# Patient Record
Sex: Male | Born: 1980 | Race: White | Hispanic: No | Marital: Single
Health system: Southern US, Community
[De-identification: ages and names within clinical notes are randomized; demographics above are authoritative.]

---
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
## 1998-03-03 ENCOUNTER — Encounter: Admission: RE | Admit: 1998-03-03 | Discharge: 1998-03-03 | Payer: Self-pay | Admitting: Internal Medicine

## 1998-05-20 ENCOUNTER — Encounter: Admission: RE | Admit: 1998-05-20 | Discharge: 1998-05-20 | Payer: Self-pay | Admitting: Internal Medicine

## 1999-01-24 ENCOUNTER — Encounter: Admission: RE | Admit: 1999-01-24 | Discharge: 1999-01-24 | Payer: Self-pay | Admitting: Internal Medicine

## 1999-01-24 ENCOUNTER — Ambulatory Visit (HOSPITAL_COMMUNITY): Admission: RE | Admit: 1999-01-24 | Discharge: 1999-01-24 | Payer: Self-pay | Admitting: Internal Medicine

## 1999-07-20 ENCOUNTER — Ambulatory Visit (HOSPITAL_COMMUNITY): Admission: RE | Admit: 1999-07-20 | Discharge: 1999-07-20 | Payer: Self-pay | Admitting: Internal Medicine

## 1999-07-20 ENCOUNTER — Encounter: Admission: RE | Admit: 1999-07-20 | Discharge: 1999-07-20 | Payer: Self-pay | Admitting: Internal Medicine

## 1999-08-08 ENCOUNTER — Encounter: Admission: RE | Admit: 1999-08-08 | Discharge: 1999-08-08 | Payer: Self-pay | Admitting: Internal Medicine

## 1999-12-14 ENCOUNTER — Encounter: Admission: RE | Admit: 1999-12-14 | Discharge: 1999-12-14 | Payer: Self-pay | Admitting: Hematology and Oncology

## 1999-12-14 ENCOUNTER — Ambulatory Visit (HOSPITAL_COMMUNITY): Admission: RE | Admit: 1999-12-14 | Discharge: 1999-12-14 | Payer: Self-pay | Admitting: Hematology and Oncology

## 2000-03-02 ENCOUNTER — Encounter: Admission: RE | Admit: 2000-03-02 | Discharge: 2000-03-02 | Payer: Self-pay | Admitting: Internal Medicine

## 2000-03-06 ENCOUNTER — Ambulatory Visit (HOSPITAL_COMMUNITY): Admission: RE | Admit: 2000-03-06 | Discharge: 2000-03-06 | Payer: Self-pay | Admitting: Internal Medicine

## 2000-03-06 ENCOUNTER — Encounter: Admission: RE | Admit: 2000-03-06 | Discharge: 2000-03-06 | Payer: Self-pay | Admitting: Internal Medicine

## 2000-04-24 ENCOUNTER — Encounter: Admission: RE | Admit: 2000-04-24 | Discharge: 2000-04-24 | Payer: Self-pay | Admitting: Hematology and Oncology

## 2000-06-27 ENCOUNTER — Encounter: Admission: RE | Admit: 2000-06-27 | Discharge: 2000-06-27 | Payer: Self-pay | Admitting: Internal Medicine

## 2000-07-27 ENCOUNTER — Encounter: Admission: RE | Admit: 2000-07-27 | Discharge: 2000-07-27 | Payer: Self-pay | Admitting: Internal Medicine

## 2000-07-27 ENCOUNTER — Ambulatory Visit (HOSPITAL_COMMUNITY): Admission: RE | Admit: 2000-07-27 | Discharge: 2000-07-27 | Payer: Self-pay | Admitting: Internal Medicine

## 2001-01-24 ENCOUNTER — Encounter: Admission: RE | Admit: 2001-01-24 | Discharge: 2001-01-24 | Payer: Self-pay | Admitting: Internal Medicine

## 2001-01-24 ENCOUNTER — Ambulatory Visit (HOSPITAL_COMMUNITY): Admission: RE | Admit: 2001-01-24 | Discharge: 2001-01-24 | Payer: Self-pay | Admitting: Internal Medicine

## 2001-07-25 ENCOUNTER — Encounter: Admission: RE | Admit: 2001-07-25 | Discharge: 2001-07-25 | Payer: Self-pay | Admitting: Internal Medicine

## 2001-09-17 ENCOUNTER — Encounter: Admission: RE | Admit: 2001-09-17 | Discharge: 2001-09-17 | Payer: Self-pay

## 2001-09-30 ENCOUNTER — Encounter: Admission: RE | Admit: 2001-09-30 | Discharge: 2001-09-30 | Payer: Self-pay | Admitting: Internal Medicine

## 2002-06-11 ENCOUNTER — Ambulatory Visit (HOSPITAL_COMMUNITY): Admission: RE | Admit: 2002-06-11 | Discharge: 2002-06-11 | Payer: Self-pay | Admitting: Internal Medicine

## 2002-06-11 ENCOUNTER — Encounter: Admission: RE | Admit: 2002-06-11 | Discharge: 2002-06-11 | Payer: Self-pay | Admitting: Internal Medicine

## 2002-11-06 ENCOUNTER — Encounter: Admission: RE | Admit: 2002-11-06 | Discharge: 2002-11-06 | Payer: Self-pay | Admitting: Internal Medicine

## 2004-03-01 ENCOUNTER — Ambulatory Visit (HOSPITAL_COMMUNITY): Admission: RE | Admit: 2004-03-01 | Discharge: 2004-03-01 | Payer: Self-pay | Admitting: Internal Medicine

## 2004-03-01 ENCOUNTER — Ambulatory Visit: Payer: Self-pay | Admitting: Internal Medicine

## 2004-03-02 ENCOUNTER — Emergency Department (HOSPITAL_COMMUNITY): Admission: EM | Admit: 2004-03-02 | Discharge: 2004-03-02 | Payer: Self-pay | Admitting: Emergency Medicine

## 2004-03-08 ENCOUNTER — Ambulatory Visit: Payer: Self-pay | Admitting: Internal Medicine

## 2004-11-14 ENCOUNTER — Ambulatory Visit (HOSPITAL_COMMUNITY): Admission: RE | Admit: 2004-11-14 | Discharge: 2004-11-14 | Payer: Self-pay | Admitting: Internal Medicine

## 2004-11-14 ENCOUNTER — Ambulatory Visit: Payer: Self-pay | Admitting: Internal Medicine

## 2004-11-17 ENCOUNTER — Ambulatory Visit: Payer: Self-pay | Admitting: Internal Medicine

## 2004-11-21 ENCOUNTER — Ambulatory Visit: Payer: Self-pay | Admitting: Internal Medicine

## 2005-01-20 ENCOUNTER — Ambulatory Visit: Payer: Self-pay | Admitting: Internal Medicine

## 2005-01-20 ENCOUNTER — Ambulatory Visit (HOSPITAL_COMMUNITY): Admission: RE | Admit: 2005-01-20 | Discharge: 2005-01-20 | Payer: Self-pay | Admitting: Internal Medicine

## 2005-02-02 ENCOUNTER — Emergency Department (HOSPITAL_COMMUNITY): Admission: EM | Admit: 2005-02-02 | Discharge: 2005-02-02 | Payer: Self-pay | Admitting: Emergency Medicine

## 2005-02-08 ENCOUNTER — Inpatient Hospital Stay (HOSPITAL_COMMUNITY): Admission: AD | Admit: 2005-02-08 | Discharge: 2005-02-15 | Payer: Self-pay | Admitting: Internal Medicine

## 2005-02-08 ENCOUNTER — Ambulatory Visit: Payer: Self-pay | Admitting: Infectious Diseases

## 2005-02-08 ENCOUNTER — Ambulatory Visit: Payer: Self-pay | Admitting: Hospitalist

## 2005-02-09 ENCOUNTER — Encounter (INDEPENDENT_AMBULATORY_CARE_PROVIDER_SITE_OTHER): Payer: Self-pay | Admitting: *Deleted

## 2005-02-09 LAB — CONVERTED CEMR LAB
CD4 Count: 642 uL
HIV 1 RNA Quant: 6460 {copies}/mL

## 2005-02-14 ENCOUNTER — Encounter (INDEPENDENT_AMBULATORY_CARE_PROVIDER_SITE_OTHER): Payer: Self-pay | Admitting: Cardiovascular Disease

## 2005-02-23 ENCOUNTER — Ambulatory Visit (HOSPITAL_COMMUNITY): Admission: RE | Admit: 2005-02-23 | Discharge: 2005-02-23 | Payer: Self-pay | Admitting: Internal Medicine

## 2005-02-23 ENCOUNTER — Ambulatory Visit: Payer: Self-pay | Admitting: Internal Medicine

## 2005-04-06 ENCOUNTER — Ambulatory Visit: Payer: Self-pay | Admitting: Internal Medicine

## 2005-04-06 ENCOUNTER — Ambulatory Visit (HOSPITAL_COMMUNITY): Admission: RE | Admit: 2005-04-06 | Discharge: 2005-04-06 | Payer: Self-pay | Admitting: Internal Medicine

## 2005-06-27 ENCOUNTER — Encounter (INDEPENDENT_AMBULATORY_CARE_PROVIDER_SITE_OTHER): Payer: Self-pay | Admitting: *Deleted

## 2005-06-27 ENCOUNTER — Ambulatory Visit: Payer: Self-pay | Admitting: Internal Medicine

## 2005-06-27 LAB — CONVERTED CEMR LAB
CD4 Count: 130 microliters
HIV 1 RNA Quant: 1000001 copies/mL

## 2005-08-03 ENCOUNTER — Ambulatory Visit: Payer: Self-pay | Admitting: Internal Medicine

## 2005-10-03 ENCOUNTER — Ambulatory Visit: Payer: Self-pay | Admitting: Internal Medicine

## 2006-04-03 ENCOUNTER — Encounter (INDEPENDENT_AMBULATORY_CARE_PROVIDER_SITE_OTHER): Payer: Self-pay | Admitting: *Deleted

## 2006-04-03 ENCOUNTER — Ambulatory Visit: Payer: Self-pay | Admitting: Internal Medicine

## 2006-04-03 ENCOUNTER — Encounter: Admission: RE | Admit: 2006-04-03 | Discharge: 2006-04-03 | Payer: Self-pay | Admitting: Internal Medicine

## 2006-04-03 LAB — CONVERTED CEMR LAB
ALT: 24 units/L (ref 0–40)
AST: 18 units/L (ref 0–37)
Albumin: 4.2 g/dL (ref 3.5–5.2)
Alkaline Phosphatase: 61 units/L (ref 39–117)
BUN: 11 mg/dL (ref 6–23)
CD4 Count: 150 microliters
CO2: 26 meq/L (ref 19–32)
Calcium: 9.4 mg/dL (ref 8.4–10.5)
Chloride: 105 meq/L (ref 96–112)
Cholesterol: 140 mg/dL (ref 0–200)
Creatinine, Ser: 0.93 mg/dL (ref 0.40–1.50)
Glucose, Bld: 98 mg/dL (ref 70–99)
HCT: 40.3 % (ref 39.0–52.0)
HDL: 34 mg/dL — ABNORMAL LOW (ref >39)
HIV 1 RNA Quant: 74300 copies/mL
HIV 1 RNA Quant: 74300 copies/mL — ABNORMAL HIGH (ref <50)
HIV-1 RNA Quant, Log: 4.87 — ABNORMAL HIGH (ref <1.70)
Hemoglobin: 12.7 g/dL — ABNORMAL LOW (ref 13.0–17.0)
LDL Cholesterol: 55 mg/dL (ref 0–99)
Leukocyte count, blood: 5.1 10*9/L (ref 4.0–10.5)
MCHC: 31.5 g/dL (ref 30.0–36.0)
MCV: 92.2 fL (ref 78.0–100.0)
Platelets: 229 10*3/uL (ref 150–400)
Potassium: 4.4 meq/L (ref 3.5–5.3)
RBC: 4.37 M/uL (ref 4.22–5.81)
RDW: 14.7 % — ABNORMAL HIGH (ref 11.5–14.0)
Sodium: 139 meq/L (ref 135–145)
Total Bilirubin: 0.3 mg/dL (ref 0.3–1.2)
Total CHOL/HDL Ratio: 4.1
Total Protein: 8.4 g/dL — ABNORMAL HIGH (ref 6.0–8.3)
Triglycerides: 254 mg/dL — ABNORMAL HIGH (ref <150)
VLDL: 51 mg/dL — ABNORMAL HIGH (ref 0–40)

## 2006-04-24 ENCOUNTER — Ambulatory Visit: Payer: Self-pay | Admitting: Internal Medicine

## 2006-06-14 ENCOUNTER — Ambulatory Visit: Payer: Self-pay | Admitting: Infectious Diseases

## 2006-07-30 ENCOUNTER — Encounter (INDEPENDENT_AMBULATORY_CARE_PROVIDER_SITE_OTHER): Payer: Self-pay | Admitting: *Deleted

## 2006-07-30 LAB — CONVERTED CEMR LAB

## 2006-08-12 ENCOUNTER — Encounter (INDEPENDENT_AMBULATORY_CARE_PROVIDER_SITE_OTHER): Payer: Self-pay | Admitting: *Deleted

## 2006-08-16 ENCOUNTER — Ambulatory Visit: Payer: Self-pay | Admitting: Internal Medicine

## 2006-08-17 ENCOUNTER — Telehealth: Payer: Self-pay

## 2006-10-11 ENCOUNTER — Ambulatory Visit: Payer: Self-pay | Admitting: Infectious Diseases

## 2006-10-14 IMAGING — CR DG CHEST 2V
2 series · 2 of 2 positions shown · non-contrast
Comparison: 03/02/04.

CLINICAL DATA: HIV positive.  Cough.  
 CHEST - 2 VIEW:

[view not recorded (1 of 2)]
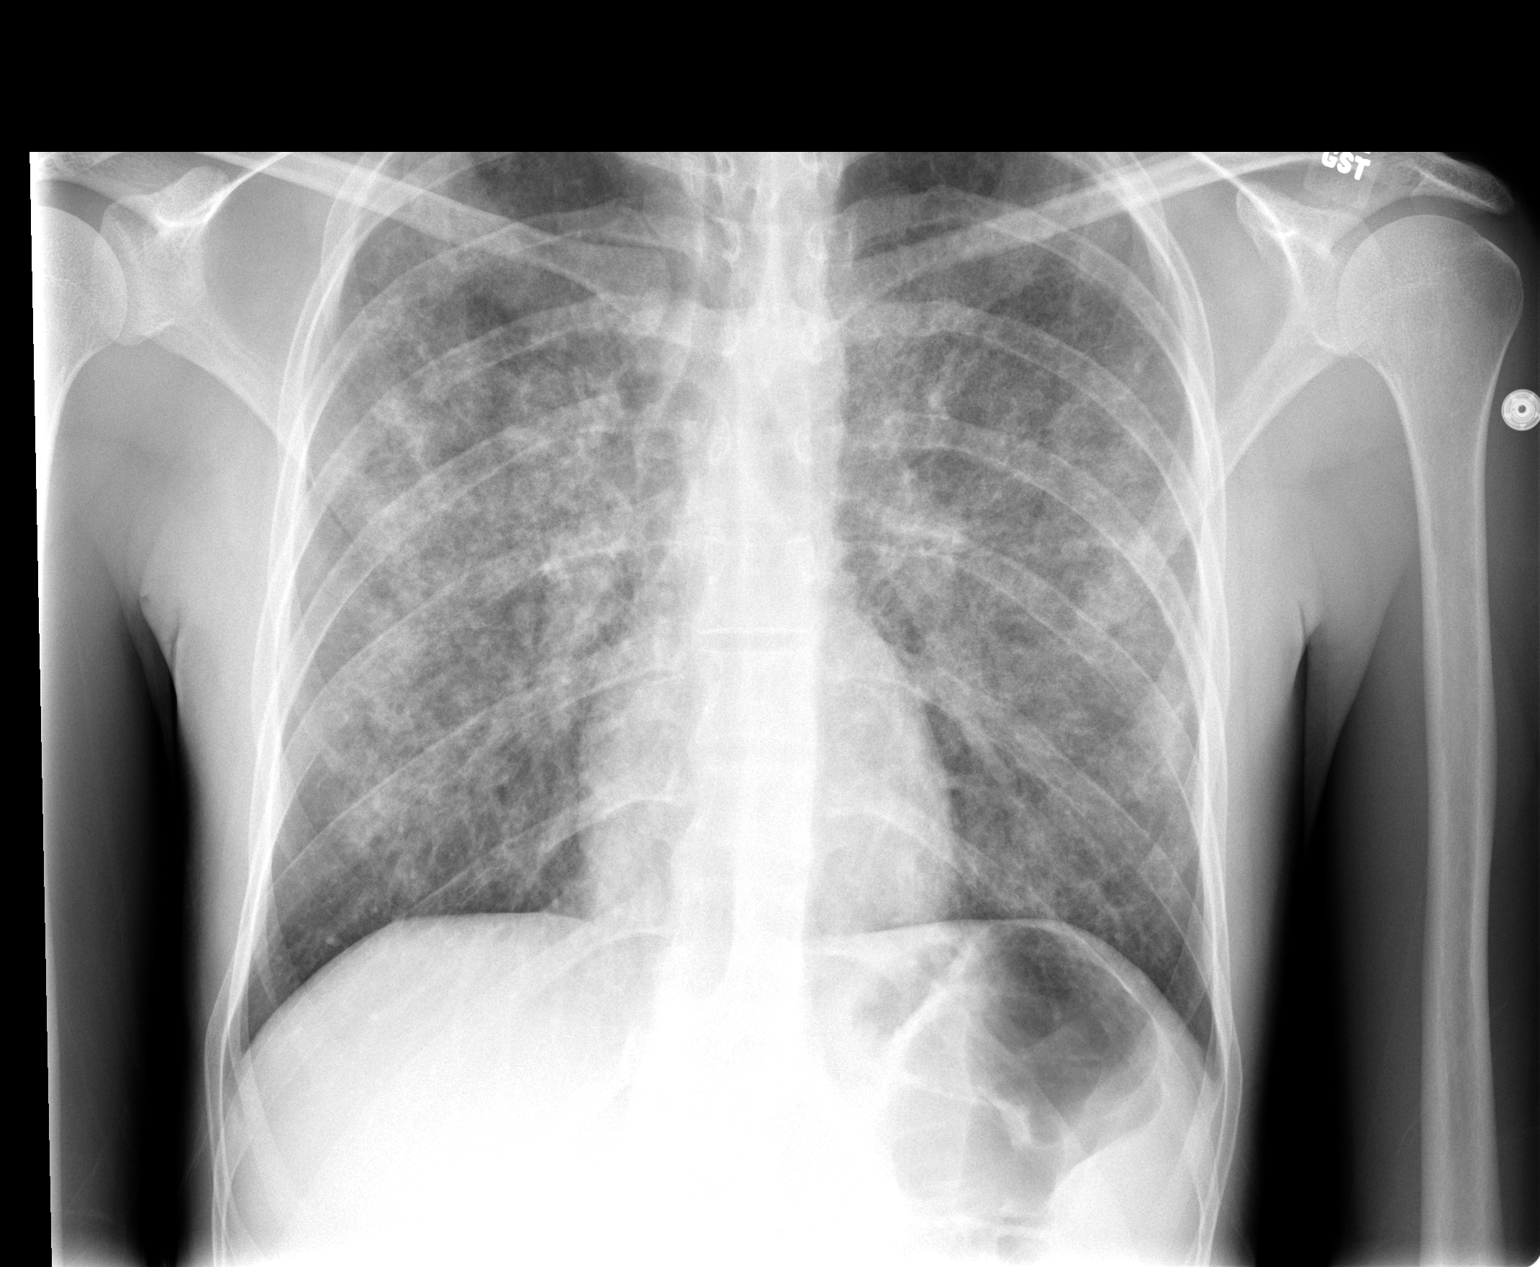

[view not recorded (2 of 2)]
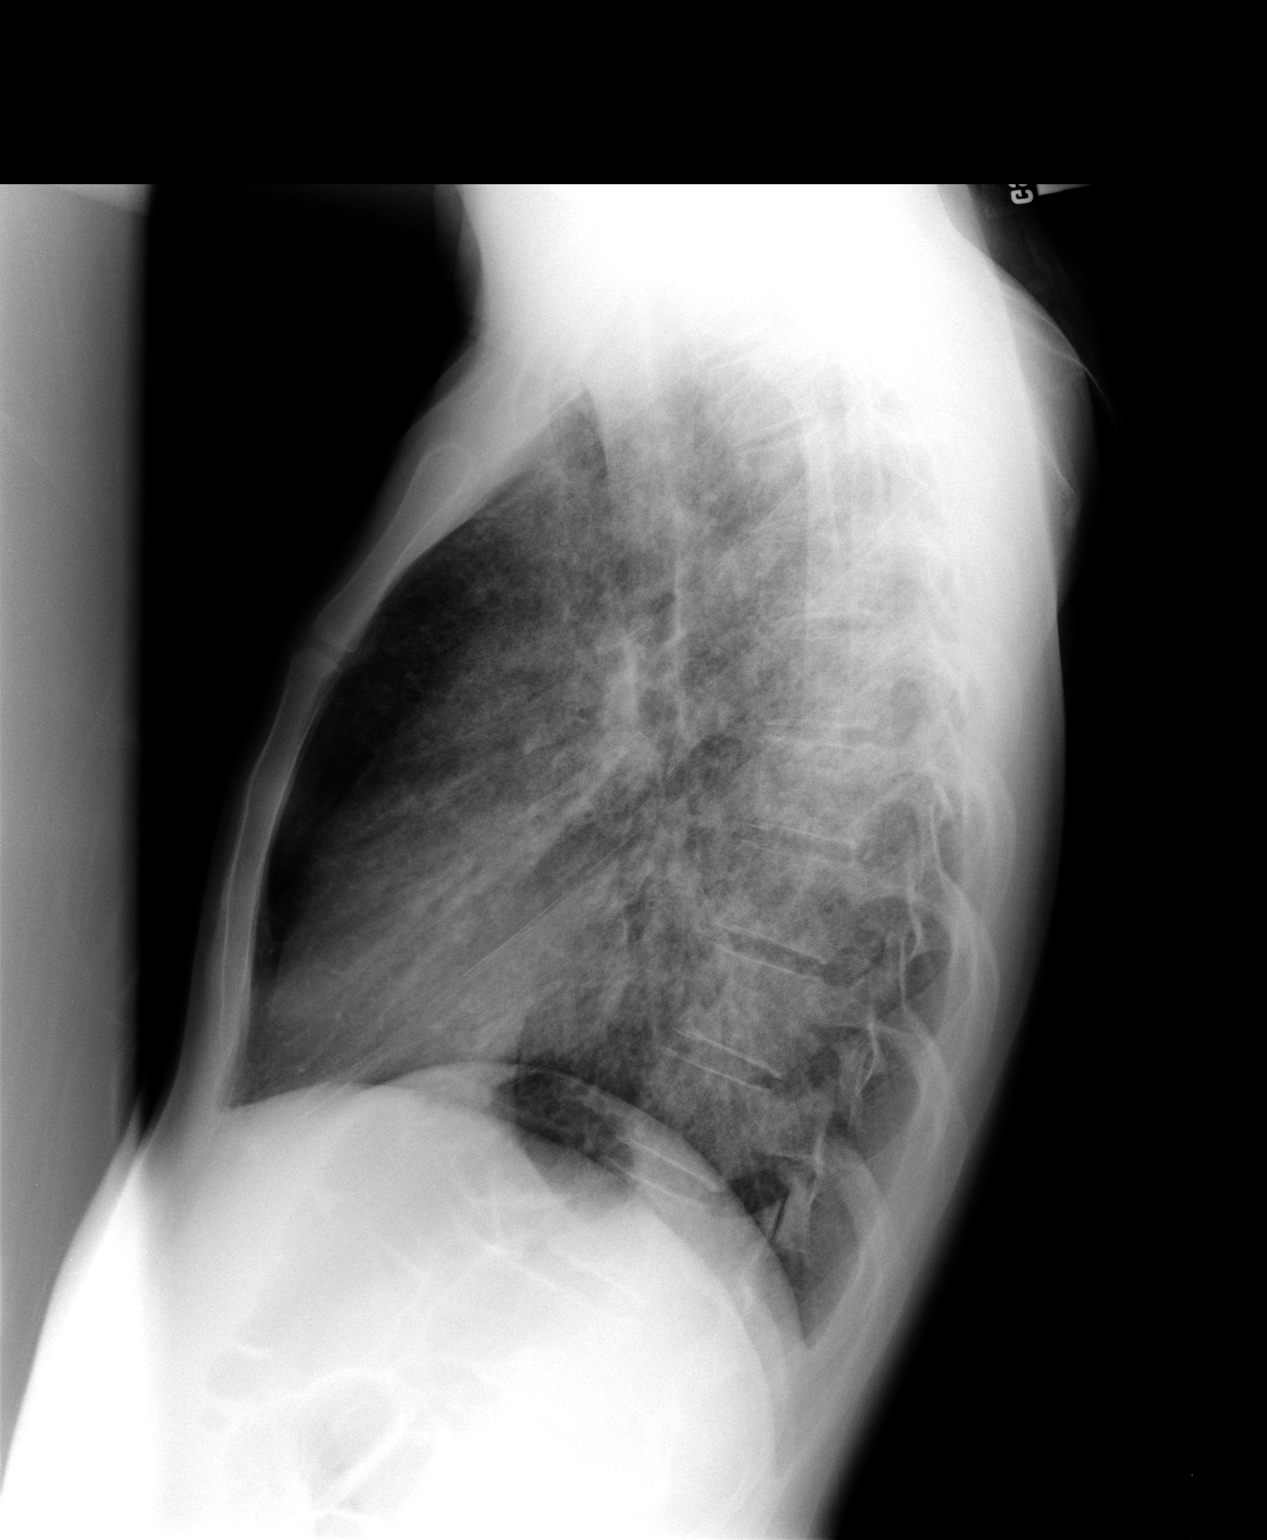

[2 of 2 positions shown; findings below may reference images not displayed]

Bilateral patchy interstitial opacities with vaguely nodular air space opacities are present bilaterally.  The appearance suggests bilateral atypical pneumonia.  Heart and mediastinum appear unremarkable.
IMPRESSION: Atypical pneumonia.

## 2007-01-24 ENCOUNTER — Ambulatory Visit: Payer: Self-pay | Admitting: Infectious Diseases

## 2007-03-14 ENCOUNTER — Ambulatory Visit: Payer: Self-pay | Admitting: Infectious Disease

## 2007-04-18 ENCOUNTER — Ambulatory Visit: Payer: Self-pay | Admitting: Infectious Diseases

## 2007-06-13 ENCOUNTER — Ambulatory Visit: Payer: Self-pay | Admitting: Infectious Diseases

## 2007-10-17 ENCOUNTER — Ambulatory Visit: Payer: Self-pay | Admitting: Infectious Diseases

## 2008-07-16 ENCOUNTER — Ambulatory Visit: Payer: Self-pay | Admitting: Infectious Diseases

## 2008-09-24 ENCOUNTER — Ambulatory Visit: Payer: Self-pay | Admitting: Internal Medicine

## 2009-10-14 ENCOUNTER — Ambulatory Visit: Payer: Self-pay | Admitting: Infectious Diseases

## 2009-12-30 ENCOUNTER — Ambulatory Visit: Payer: Self-pay | Admitting: Infectious Diseases

## 2010-05-25 ENCOUNTER — Ambulatory Visit: Payer: Self-pay | Admitting: Thoracic Surgery

## 2010-06-06 ENCOUNTER — Encounter: Payer: Self-pay | Admitting: Thoracic Surgery

## 2010-06-06 ENCOUNTER — Inpatient Hospital Stay (HOSPITAL_COMMUNITY)
Admission: RE | Admit: 2010-06-06 | Discharge: 2010-06-09 | Payer: Self-pay | Source: Home / Self Care | Attending: Thoracic Surgery | Admitting: Thoracic Surgery

## 2010-06-08 LAB — COMPREHENSIVE METABOLIC PANEL
ALT: 17 U/L (ref 0–53)
AST: 26 U/L (ref 0–37)
Albumin: 2.7 g/dL — ABNORMAL LOW (ref 3.5–5.2)
Alkaline Phosphatase: 64 U/L (ref 39–117)
BUN: 5 mg/dL — ABNORMAL LOW (ref 6–23)
CO2: 31 mEq/L (ref 19–32)
Calcium: 9 mg/dL (ref 8.4–10.5)
Chloride: 100 mEq/L (ref 96–112)
Creatinine, Ser: 0.98 mg/dL (ref 0.4–1.5)
GFR calc Af Amer: >60 mL/min (ref >60)
GFR calc non Af Amer: >60 mL/min (ref >60)
Glucose, Bld: 132 mg/dL — ABNORMAL HIGH (ref 70–99)
Potassium: 4.5 mEq/L (ref 3.5–5.1)
Sodium: 140 mEq/L (ref 135–145)
Total Bilirubin: 0.4 mg/dL (ref 0.3–1.2)
Total Protein: 6.5 g/dL (ref 6.0–8.3)

## 2010-06-08 LAB — CBC
HCT: 37.6 % — ABNORMAL LOW (ref 39.0–52.0)
Hemoglobin: 12 g/dL — ABNORMAL LOW (ref 13.0–17.0)
MCH: 31.5 pg (ref 26.0–34.0)
MCHC: 31.9 g/dL (ref 30.0–36.0)
MCV: 98.7 fL (ref 78.0–100.0)
Platelets: 253 10*3/uL (ref 150–400)
RBC: 3.81 MIL/uL — ABNORMAL LOW (ref 4.22–5.81)
RDW: 12 % (ref 11.5–15.5)
WBC: 14.5 10*3/uL — ABNORMAL HIGH (ref 4.0–10.5)

## 2010-06-09 ENCOUNTER — Encounter: Payer: Self-pay | Admitting: Infectious Disease

## 2010-06-15 ENCOUNTER — Encounter
Admission: RE | Admit: 2010-06-15 | Discharge: 2010-06-15 | Payer: Self-pay | Source: Home / Self Care | Attending: Thoracic Surgery | Admitting: Thoracic Surgery

## 2010-06-15 ENCOUNTER — Ambulatory Visit
Admission: RE | Admit: 2010-06-15 | Discharge: 2010-06-15 | Payer: Self-pay | Source: Home / Self Care | Attending: Thoracic Surgery | Admitting: Thoracic Surgery

## 2010-06-28 ENCOUNTER — Ambulatory Visit
Admission: RE | Admit: 2010-06-28 | Discharge: 2010-06-28 | Payer: Self-pay | Source: Home / Self Care | Attending: Thoracic Surgery | Admitting: Thoracic Surgery

## 2010-06-28 ENCOUNTER — Encounter
Admission: RE | Admit: 2010-06-28 | Discharge: 2010-06-28 | Payer: Self-pay | Source: Home / Self Care | Attending: Thoracic Surgery | Admitting: Thoracic Surgery

## 2010-06-29 NOTE — Assessment & Plan Note (Addendum)
OFFICE VISIT  Demonbreun, Nur DOB:  04-14-1981                                        June 28, 2010 CHART #:  [id number]  The patient returns today and is doing well.  His blood pressure is 131/87, pulse 68, respirations 16, sats were 97%.  Chest x-ray showed resolution of effusion.  He is doing well overall.  He is being followed by Infectious Disease by Dr. Priscille Kluver and I will see him back again in 6 weeks.  Ines Bloomer, M.D. Electronically Signed  DPB/MEDQ  D:  06/28/2010  T:  06/29/2010  Job:  [id number]

## 2010-07-04 ENCOUNTER — Encounter: Payer: Self-pay | Admitting: Infectious Disease

## 2010-07-11 ENCOUNTER — Encounter: Payer: Self-pay | Admitting: Infectious Disease

## 2010-08-02 NOTE — Consult Note (Signed)
Summary: [facility]: Stress Test   [facility]: Stress [facility]   Imported By: Florinda Marker 07/27/2010 11:02:56  _____________________________________________________________________  External Attachment:    Type:   Image     Comment:   External Document

## 2010-08-02 NOTE — Consult Note (Signed)
Summary: [facility] 12/21  [facility] 12/21   Imported By: Florinda Marker 07/27/2010 10:45:29  _____________________________________________________________________  External Attachment:    Type:   Image     Comment:   External Document

## 2010-08-08 ENCOUNTER — Other Ambulatory Visit: Payer: Self-pay | Admitting: Thoracic Surgery

## 2010-08-08 DIAGNOSIS — D381 Neoplasm of uncertain behavior of trachea, bronchus and lung: Secondary | ICD-10-CM

## 2010-08-09 ENCOUNTER — Ambulatory Visit: Payer: Self-pay | Admitting: Thoracic Surgery

## 2010-08-15 LAB — TYPE AND SCREEN
ABO/RH(D): A POS
Antibody Screen: NEGATIVE
Unit division: 0
Unit division: 0

## 2010-08-15 LAB — CBC
HCT: 37.8 % — ABNORMAL LOW (ref 39.0–52.0)
HCT: 44.5 % (ref 39.0–52.0)
Hemoglobin: 12.2 g/dL — ABNORMAL LOW (ref 13.0–17.0)
Hemoglobin: 14.6 g/dL (ref 13.0–17.0)
MCH: 31.5 pg (ref 26.0–34.0)
MCH: 31.8 pg (ref 26.0–34.0)
MCHC: 32.8 g/dL (ref 30.0–36.0)
MCV: 96.9 fL (ref 78.0–100.0)
MCV: 97.7 fL (ref 78.0–100.0)
Platelets: 273 10*3/uL (ref 150–400)
RBC: 3.87 MIL/uL — ABNORMAL LOW (ref 4.22–5.81)
RBC: 4.59 MIL/uL (ref 4.22–5.81)
WBC: 15.2 10*3/uL — ABNORMAL HIGH (ref 4.0–10.5)

## 2010-08-15 LAB — MRSA PCR SCREENING: MRSA by PCR: NEGATIVE

## 2010-08-15 LAB — URINALYSIS, ROUTINE W REFLEX MICROSCOPIC
Bilirubin Urine: NEGATIVE
Glucose, UA: NEGATIVE mg/dL
Hgb urine dipstick: NEGATIVE
Specific Gravity, Urine: 1.017 (ref 1.005–1.030)
Urobilinogen, UA: 0.2 mg/dL (ref 0.0–1.0)

## 2010-08-15 LAB — BLOOD GAS, ARTERIAL
Acid-base deficit: 0.8 mmol/L (ref 0.0–2.0)
Bicarbonate: 22.9 mEq/L (ref 20.0–24.0)
FIO2: 0.21 %
O2 Saturation: 97.8 %
Patient temperature: 98.6
TCO2: 24 mmol/L (ref 0–100)

## 2010-08-15 LAB — AFB CULTURE WITH SMEAR (NOT AT ARMC): Acid Fast Smear: NONE SEEN

## 2010-08-15 LAB — FUNGUS CULTURE W SMEAR: Fungal Smear: NONE SEEN

## 2010-08-15 LAB — POCT I-STAT 3, ART BLOOD GAS (G3+)
Bicarbonate: 28.4 mEq/L — ABNORMAL HIGH (ref 20.0–24.0)
Patient temperature: 98
TCO2: 30 mmol/L (ref 0–100)
pH, Arterial: 7.376 (ref 7.350–7.450)
pO2, Arterial: 88 mmHg (ref 80.0–100.0)

## 2010-08-15 LAB — TISSUE CULTURE
Culture: NO GROWTH
Gram Stain: NONE SEEN

## 2010-08-15 LAB — BASIC METABOLIC PANEL
CO2: 27 mEq/L (ref 19–32)
Chloride: 102 mEq/L (ref 96–112)
GFR calc Af Amer: >60 mL/min (ref >60)
Potassium: 4.3 mEq/L (ref 3.5–5.1)
Sodium: 137 mEq/L (ref 135–145)

## 2010-08-15 LAB — COMPREHENSIVE METABOLIC PANEL
ALT: 22 U/L (ref 0–53)
CO2: 21 mEq/L (ref 19–32)
Calcium: 9.5 mg/dL (ref 8.4–10.5)
Chloride: 105 mEq/L (ref 96–112)
Creatinine, Ser: 1.16 mg/dL (ref 0.4–1.5)
GFR calc non Af Amer: >60 mL/min (ref >60)
Glucose, Bld: 103 mg/dL — ABNORMAL HIGH (ref 70–99)
Total Bilirubin: 0.7 mg/dL (ref 0.3–1.2)

## 2010-08-15 LAB — ABO/RH: ABO/RH(D): A POS

## 2010-08-15 LAB — SURGICAL PCR SCREEN: Staphylococcus aureus: NEGATIVE

## 2010-08-15 LAB — PROTIME-INR: Prothrombin Time: 13.2 seconds (ref 11.6–15.2)

## 2010-08-15 LAB — APTT: aPTT: 38 seconds — ABNORMAL HIGH (ref 24–37)

## 2010-08-17 ENCOUNTER — Ambulatory Visit
Admission: RE | Admit: 2010-08-17 | Discharge: 2010-08-17 | Disposition: A | Discharge status: Home / Self Care | Payer: Medicaid Other | Source: Ambulatory Visit | Attending: Thoracic Surgery | Admitting: Thoracic Surgery

## 2010-08-17 ENCOUNTER — Other Ambulatory Visit: Payer: Self-pay | Admitting: Thoracic Surgery

## 2010-08-17 ENCOUNTER — Ambulatory Visit (INDEPENDENT_AMBULATORY_CARE_PROVIDER_SITE_OTHER): Payer: Self-pay | Admitting: Thoracic Surgery

## 2010-08-17 DIAGNOSIS — D381 Neoplasm of uncertain behavior of trachea, bronchus and lung: Secondary | ICD-10-CM

## 2010-08-17 DIAGNOSIS — J8410 Pulmonary fibrosis, unspecified: Secondary | ICD-10-CM

## 2010-08-18 NOTE — Assessment & Plan Note (Signed)
OFFICE VISIT  [name], Perley DOB:  04/30/1981                                        August 17, 2010 CHART #:  [id number]  I have noticed his blood pressure is 120/81, pulse 66, respirations 16, sats were 98%.  He had a recent syncopal episode with also an injury to his elbow and his left wrist.  His chest x-ray today showed normal postoperative changes.  His incisions are well healed.  His lungs are clear to auscultation and percussion.  I have released him to full activity and I will see him back again if he has any further pulmonary problems.  Ines Bloomer, M.D. Electronically Signed  DPB/MEDQ  D:  08/17/2010  T:  08/18/2010  Job:  [id number]

## 2010-10-07 ENCOUNTER — Encounter: Payer: Self-pay | Admitting: Infectious Disease

## 2010-10-18 NOTE — Letter (Signed)
May 25, 2010   Acey Lav, MD  [address] [place] [address]   Re:  Sean, PEPLINSKI                  DOB:  02-17-1981   Dr. 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  There was a 1.6 nodule in the left lingula.  His x-rays done in  2006, in the [facility] did not show any nodules at all.  He has had no  fever, chills or excessive sputum.  No adenopathy.  His other medical  problems include hypertension, depression and eczema.  He smokes half  pack of cigarettes a day.  He has had no hemoptysis, fever, chills or  excessive sputum.   His past medical history is as mentioned, he was positive for  hypertension.   He takes Truvada, Prezista, ritonavir, Xanax, celexa,  triamcinolone and  Claritin.   He has no known allergies.   His laboratory in recent hospital showed an CD-4 count of 174.   His family history is noncontributory.   Social history, he is single, smokes 5 pack cigarettes a day.   REVIEW OF SYSTEMS:  VITAL SIGNS:  He is 145 pounds, he is 5 feet 9  inches, his weight has been stable.  He has actually gained some weight.  CARDIAC:  No angina or atrial fibrillation.  PULMONARY:  No hemoptysis.  See history of present illness.  GI:  Reflux.  GU:  No kidney disease, dysuria or frequent urination.  VASCULAR:  No claudication, DVT or TIAs.  NEUROLOGICAL:  He has got muscle pain, psychiatric depression or  nervousness.  ENT:  No changes in eyesight or hearing.  HEMATOLOGIC:  No problems with bleeding, clotting disorders or anemia.   PHYSICAL EXAMINATION:  Vital Signs:  His blood pressure is 146/90, pulse  100, respirations 18, sats were 96%.  Head, Eyes, Ears, Nose and Throat:  Unremarkable.  Neck:  Supple without thyromegaly.  There is no  supraclavicular or axillary adenopathy.   Chest:  Clear to auscultation  and percussion.  Heart:  Regular sinus rhythm.  No murmurs.  Abdomen:  Soft.  There is no hepatosplenomegaly.  Extremities:  Pulses 2+.  There  is no clubbing or edema.  Neurologic:  He is oriented x3.  Sensory and  motor intact.  Cranial nerves intact.   I have discussed the situation with him and this nodule was not present  in his 2006 x-rays and is now there.  I would think this is probably  could be a carcinoid tumor or benign tumor such as a hamartoma or I  doubt that this is an infectious process, but that is another  possibility given his immunodepression.  I have recommended that he have  this removed with a left VATS approach and probably a left lingulectomy.  We will get pulmonary function tests prior to his surgery.  I appreciate  the opportunity of seeing the patient.  We planned to do the surgery on  June 06, 2010.   Ines Bloomer, M.D.  Electronically Signed   DPB/MEDQ  D:  05/25/2010  T:  05/25/2010  Job:  [id number]

## 2010-10-18 NOTE — Letter (Signed)
June 15, 2010   Acey Lav, MD  [address] [place], [place] [address]   Re:  [name], LAUNER                  DOB:  1980/11/14   Dear Dr. 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  So far, cultures have been negative and stains were  negative.  His incisions are well healed and we removed his chest tube  sutures.  The only other thing that we saw was he still has a small left  effusion.  We will see him back again in 2 weeks to follow up on this.  His lungs are clear to auscultation and percussion.  His blood pressure  was 138/87, pulse 100, respirations 16, and sats were 95%.  He is  feeling well overall, but we will reevaluate him again in 2 weeks.  I  told him to gradually increase his activities.   Sincerely,   Ines Bloomer, M.D.  Electronically Signed   DPB/MEDQ  D:  06/15/2010  T:  06/16/2010  Job:  [id number]

## 2010-10-20 DIAGNOSIS — B20 Human immunodeficiency virus [HIV] disease: Secondary | ICD-10-CM

## 2010-10-20 DIAGNOSIS — R55 Syncope and collapse: Secondary | ICD-10-CM

## 2010-10-20 DIAGNOSIS — F411 Generalized anxiety disorder: Secondary | ICD-10-CM

## 2010-10-20 DIAGNOSIS — R21 Rash and other nonspecific skin eruption: Secondary | ICD-10-CM

## 2010-10-21 NOTE — Discharge Summary (Signed)
[name], [name]                ACCOUNT NO.:  [id number]   MEDICAL RECORD NO.:  [id number]          PATIENT TYPE:  INP   LOCATION:  5702                         FACILITY:  [facility]   PHYSICIAN:  Peggye Pitt, M.D. DATE OF BIRTH:  [birth year]   DATE OF ADMISSION:  02/08/2005  DATE OF DISCHARGE:  02/15/2005                                 DISCHARGE SUMMARY   DISCHARGE DIAGNOSES:  1.  Immune reconstitution syndrome.  2.  Human immunodeficiency virus with CD-4 count of 694, viral load of 6460.   DISCHARGE MEDICATIONS:  1.  Diflucan 100 mg p.o. q. daily.  2.  Ambien 5 mg p.o. q.h.s., to be discontinued at discretion of primary      care physician.   DISPOSITION:  The patient will followup with his primary care physician, Dr.  Orvan Falconer, [facility] on February 23, 2005 at 2:30  p.m.   PENDING LABORATORY DATA:  PCP, PFA, and his blood AFB cultures, which should  be reviewed at time of followup.   PROCEDURE:  1.  The patient had a CT scan on February 09, 2005 of the abdomen consistent      with small kidney cyst and significant amount of stool throughout the      colon with no acute abnormality otherwise.  2.  The patient also had a chest x-ray on February 09, 2005, consistent with      bilateral atypical pneumonia, questionable PCP or MAC. Repeat chest x-      rays showed no improvement.  3.  The patient also had a 2-D echocardiogram on February 14, 2005,      consistent with a left ventricular ejection fraction of 40% to 50% and a      mild diffuse hypokinesis of the left ventricle.   CONSULTATIONS:  Infectious Disease was consulted for management of this  patient's human immunodeficiency virus. See by both Dr. Ninetta Lights and Dr.  Orvan Falconer.   HISTORY OF PRESENT ILLNESS:  For full details, please refer to the history  and physical in the chart. The patient is a 30 year old white male with  history of HIV diagnosed in 1998, transferred from [facility]  on  February 08, 2005 for management of fevers of unknown origin, nausea and  vomiting, and abdominal pain. At [facility], chest x-ray was done,  which showed bilateral atypical infiltrates, for which Levaquin was started.  It is unknown at this time for how long patient was on the Levaquin.   ALLERGIES:  The patient is allergic to Penicillin with Anaphylactic  reactions.   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   The patient was discharged from [facility] on Levaquin 750 mg p.o.  q.d., __________ Diflucan 100 mg p.o. q.d., and Bactrim, weekly doses.   SOCIAL HISTORY:  The patient is a current smoker. Smokes about 1 pack per  week. An occasional alcohol drinker. No IV drugs, marijuana (the last time  he used it was 1 years ago). He is single and lives with his mother.   FAMILY HISTORY:  Non-significant.   REVIEW OF SYSTEMS:  Positive for fever, chills, weight loss, fatigue,  nausea, vomiting, diarrhea, constipation, back pain. Negative for chest  pain, dyspnea, palpitations, orthopnea, paroxysmal nocturnal dyspnea, joint  pain, dysuria, urinary frequency.   PHYSICAL EXAMINATION:  VITAL SIGNS:  Temperature 97.4, blood pressure  120/72, pulse 100, respiratory rate 20. O2 saturation 97% on room air.  GENERAL:  Alert, awake and oriented times three. Thin and in no acute  distress.  HEENT:  Eyes:  Pupils are equal, round, and reactive to light and  accommodation. Extraocular movements were intact. Clear sclerae. ENT:  Normocephalic and non-traumatic. Clear oropharynx with no thrush.  NECK:  Supple. No lymphadenopathy. No jugular venous distention. No  masses.  LUNGS:  Decreased air movement but no audible crackles or wheezes.  CARDIOVASCULAR:  Regular rate and rhythm. No murmur, rub, or gallop. Good  peripheral pulses.  GASTROINTESTINAL:  Abdomen soft. Diffusely tender to palpation, mostly in  the right upper quadrant. No hepatosplenomegaly with positive bowel sounds,  tympanic x4.  EXTREMITIES:  No lower extremity edema with 2+ distal pedal pulses.  SKIN:  Warm with no swelling.  NEUROLOGIC:  Mental status intact. Cranial nerves 2-12 are intact. Muscle  strength 5 over 5 bilaterally. Deep tendon reflexes 2+ throughout. Sensation  intact to light touch. Finger nose test normal. Gait normal.   LABORATORY DATA:  On admission white blood cell count 8.9. Hemoglobin and  hematocrit 11.3 and 33.8 with an MCV of 86.8. Platelet count of 387,000. ANC  7.0. Sodium 134, potassium 3.7, chloride 104, CO2 23, BUN 6, creatinine 1.1,  glucose 111, bilirubin 0.5, alkaline phosphatase 55, AST 15, ALT 15, total  protein 7.3, albumin 2.1 and calcium of 8.6.   HOSPITAL COURSE:  PROBLEM 1:  ABDOMINAL PAIN, NAUSEA, AND VOMITING:  CT scan  showed significant amount of stool throughout the colon. Pain improved  following a large bowel movement. At the time of discharge, this problem had  resolved completely. The patient is tolerating p.o.'s well. Has had no  episodes of vomiting during his hospital stay.   PROBLEM 2:  FEVERS:  During the morning of February 11, 2005, the patient  spiked a fever to 101.3, at which time he was put in isolation and blood AFB  cultures, CMV antibodies, and blood fungal cultures were obtained. All were  negative except for a CMV/IGM of 1.87, indicative of a recent infection and  a CMV/IGG greater than 13.20. The blood AFB culture is still pending at time  of discharge. Other than this fever, the patient remained afebrile  throughout entire hospital stay. Infectious Disease was consulted at this point and antibiotic treatment  was decided to be held because, even though  chest x-ray was positive for atypical pneumonia, the patient remained  without leukocytosis and was not hypoxic at time of hospitalization. Urine  culture was negative as well.   PROBLEM 3:  QUESTIONABLE PNEUMONIA:  Chest x-ray showed atypical bilateral  infiltrates, positive for questionable PCP, MAC, or CMV pneumonia. However,  the patient remained afebrile except for that one temperature on February 11, 2005 with no leukocytosis, no cough, no sputum production, no hypoxia. So  he was decided to be kept off antibiotics. CMV/IGG  and IGM antibodies were  elevated, although it was thought to have been a likely diagnosis,  especially with his high CD-4 count and with his clinical presentation. So  no antibiotics were started.   PROBLEM 4:  DIARRHEA:  All stool cultures were negative. Fecal white blood  cells were negative. His diarrhea improved over the course of his hospital  stay and at time of discharge, he is having no more episodes of diarrhea.   PROBLEM 5:  HUMAN IMMUNODEFICIENCY VIRUS:  CD-4 count checked during this  admission is 642, viral load of 6460. Infectious Disease consultants have  decided to keep him off of anti-retro viral's for now.   His clinical presentation was thought to be secondary to viral immune  reconstitution syndrome.   DISCHARGE LABORATORY DATA:  Sodium 140, potassium 4.3, chloride 106, CO2 27,  BUN 5, creatinine 0.9, glucose 95 and calcium of 9.4. White blood cell count  6.1. Hemoglobin and hematocrit 10.3 and 30.3. MCV of 87.9. Platelets of 528.      Peggye Pitt, M.D.     EH/MEDQ  D:  02/15/2005  T:  02/15/2005  Job:  [id number]   cc:   Cliffton Asters, M.D.  [address] [place]  [place] [address]  Fax: [phone]

## 2010-11-02 ENCOUNTER — Encounter: Payer: Self-pay | Admitting: Infectious Disease

## 2010-12-15 DIAGNOSIS — B20 Human immunodeficiency virus [HIV] disease: Secondary | ICD-10-CM

## 2011-03-02 ENCOUNTER — Other Ambulatory Visit: Payer: Self-pay | Admitting: Infectious Disease

## 2011-05-04 ENCOUNTER — Other Ambulatory Visit: Payer: Self-pay | Admitting: Infectious Disease

## 2011-05-23 DIAGNOSIS — B20 Human immunodeficiency virus [HIV] disease: Secondary | ICD-10-CM

## 2011-07-11 DIAGNOSIS — K644 Residual hemorrhoidal skin tags: Secondary | ICD-10-CM | POA: Diagnosis not present

## 2011-07-25 DIAGNOSIS — A630 Anogenital (venereal) warts: Secondary | ICD-10-CM | POA: Diagnosis not present

## 2011-08-02 DIAGNOSIS — I10 Essential (primary) hypertension: Secondary | ICD-10-CM | POA: Diagnosis not present

## 2011-08-02 DIAGNOSIS — Z79899 Other long term (current) drug therapy: Secondary | ICD-10-CM | POA: Diagnosis not present

## 2011-08-02 DIAGNOSIS — A630 Anogenital (venereal) warts: Secondary | ICD-10-CM | POA: Diagnosis not present

## 2011-08-02 DIAGNOSIS — F17200 Nicotine dependence, unspecified, uncomplicated: Secondary | ICD-10-CM | POA: Diagnosis not present

## 2011-08-02 DIAGNOSIS — Z21 Asymptomatic human immunodeficiency virus [HIV] infection status: Secondary | ICD-10-CM | POA: Diagnosis not present

## 2011-08-07 DIAGNOSIS — F411 Generalized anxiety disorder: Secondary | ICD-10-CM | POA: Diagnosis not present

## 2011-08-07 DIAGNOSIS — N289 Disorder of kidney and ureter, unspecified: Secondary | ICD-10-CM | POA: Diagnosis not present

## 2011-08-07 DIAGNOSIS — Z79899 Other long term (current) drug therapy: Secondary | ICD-10-CM | POA: Diagnosis not present

## 2011-08-07 DIAGNOSIS — B20 Human immunodeficiency virus [HIV] disease: Secondary | ICD-10-CM | POA: Diagnosis not present

## 2011-08-14 DIAGNOSIS — A630 Anogenital (venereal) warts: Secondary | ICD-10-CM | POA: Diagnosis not present

## 2011-08-17 DIAGNOSIS — Z79899 Other long term (current) drug therapy: Secondary | ICD-10-CM | POA: Diagnosis not present

## 2011-08-17 DIAGNOSIS — F411 Generalized anxiety disorder: Secondary | ICD-10-CM | POA: Diagnosis not present

## 2011-08-17 DIAGNOSIS — B20 Human immunodeficiency virus [HIV] disease: Secondary | ICD-10-CM | POA: Diagnosis not present

## 2011-08-17 DIAGNOSIS — N289 Disorder of kidney and ureter, unspecified: Secondary | ICD-10-CM | POA: Diagnosis not present

## 2011-08-29 DIAGNOSIS — B20 Human immunodeficiency virus [HIV] disease: Secondary | ICD-10-CM

## 2011-12-17 DIAGNOSIS — B029 Zoster without complications: Secondary | ICD-10-CM | POA: Diagnosis not present

## 2011-12-17 DIAGNOSIS — R21 Rash and other nonspecific skin eruption: Secondary | ICD-10-CM | POA: Diagnosis not present

## 2011-12-25 DIAGNOSIS — B029 Zoster without complications: Secondary | ICD-10-CM | POA: Diagnosis not present

## 2012-01-01 DIAGNOSIS — B20 Human immunodeficiency virus [HIV] disease: Secondary | ICD-10-CM | POA: Diagnosis not present

## 2012-01-11 DIAGNOSIS — I10 Essential (primary) hypertension: Secondary | ICD-10-CM | POA: Diagnosis not present

## 2012-01-11 DIAGNOSIS — B20 Human immunodeficiency virus [HIV] disease: Secondary | ICD-10-CM | POA: Diagnosis not present

## 2012-01-11 DIAGNOSIS — F17200 Nicotine dependence, unspecified, uncomplicated: Secondary | ICD-10-CM | POA: Diagnosis not present

## 2012-01-11 DIAGNOSIS — Z79899 Other long term (current) drug therapy: Secondary | ICD-10-CM | POA: Diagnosis not present

## 2012-02-08 IMAGING — CR DG CHEST 1V PORT
1 series · 1 of 1 positions shown · non-contrast
Comparison: Chest radiograph 1N T 1791 and [DATE] hours

CLINICAL DATA: Lung mass, postop

PORTABLE CHEST - 1 VIEW

[view not recorded]
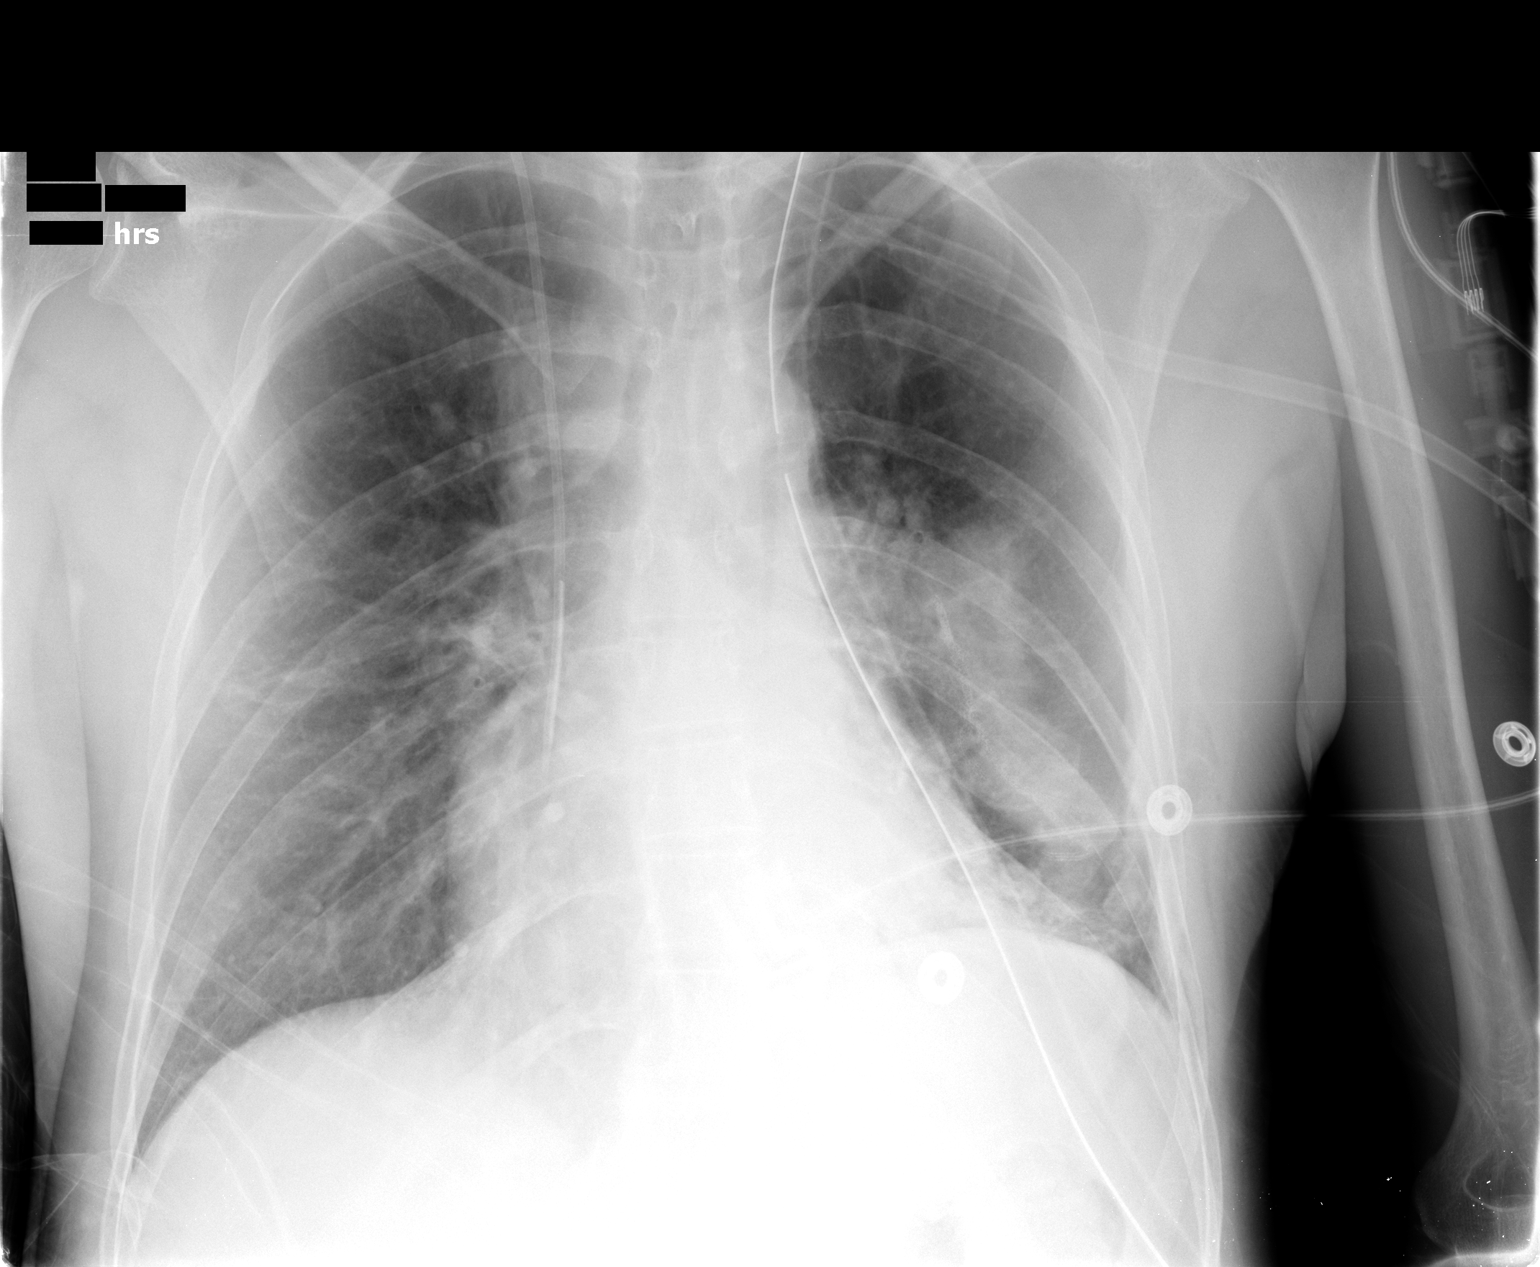

[1 of 1 positions shown; findings below may reference images not displayed]

FINDINGS: Patient status post VATS.  There is a chest tube in the
left hemithorax.  There is volume loss in the left hemithorax and
new surgical staple line.  There is a small contusion in the left
lung.  There is a small left apical pneumothorax.  Right lung is
clear.  There is a right central venous line with tip in the distal
SVC peri
IMPRESSION: 1.  Expected postop change in the left hemithorax following VATS.
2.  Small left apical pneumothorax with chest tube in place.
3.  Right central venous line with tip in distal SVC.  No evidence
right pneumothorax.

## 2012-02-09 IMAGING — CR DG CHEST 1V PORT
2 series · 2 of 2 positions shown · non-contrast
Comparison: 1 day prior

CLINICAL DATA: Lung mass.  Status post VATS

PORTABLE CHEST - 1 VIEW

[AP (1 of 2)]
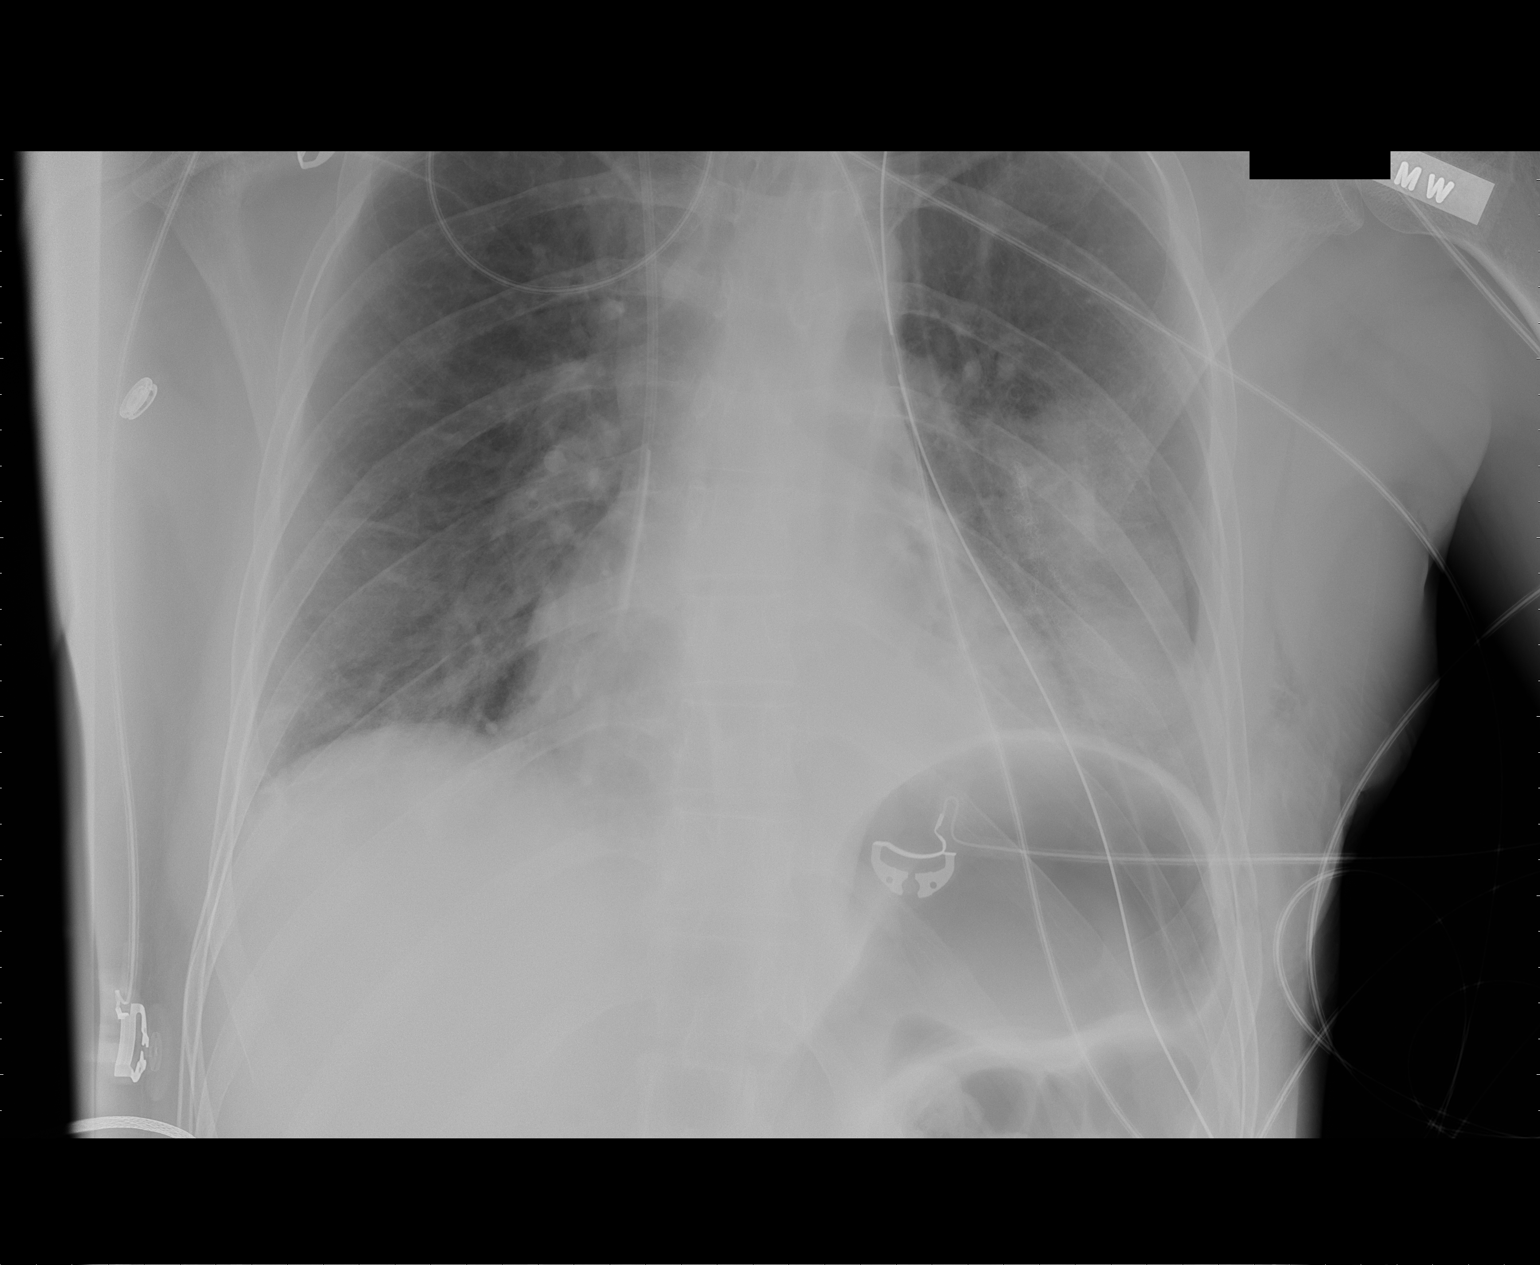

[AP (2 of 2)]
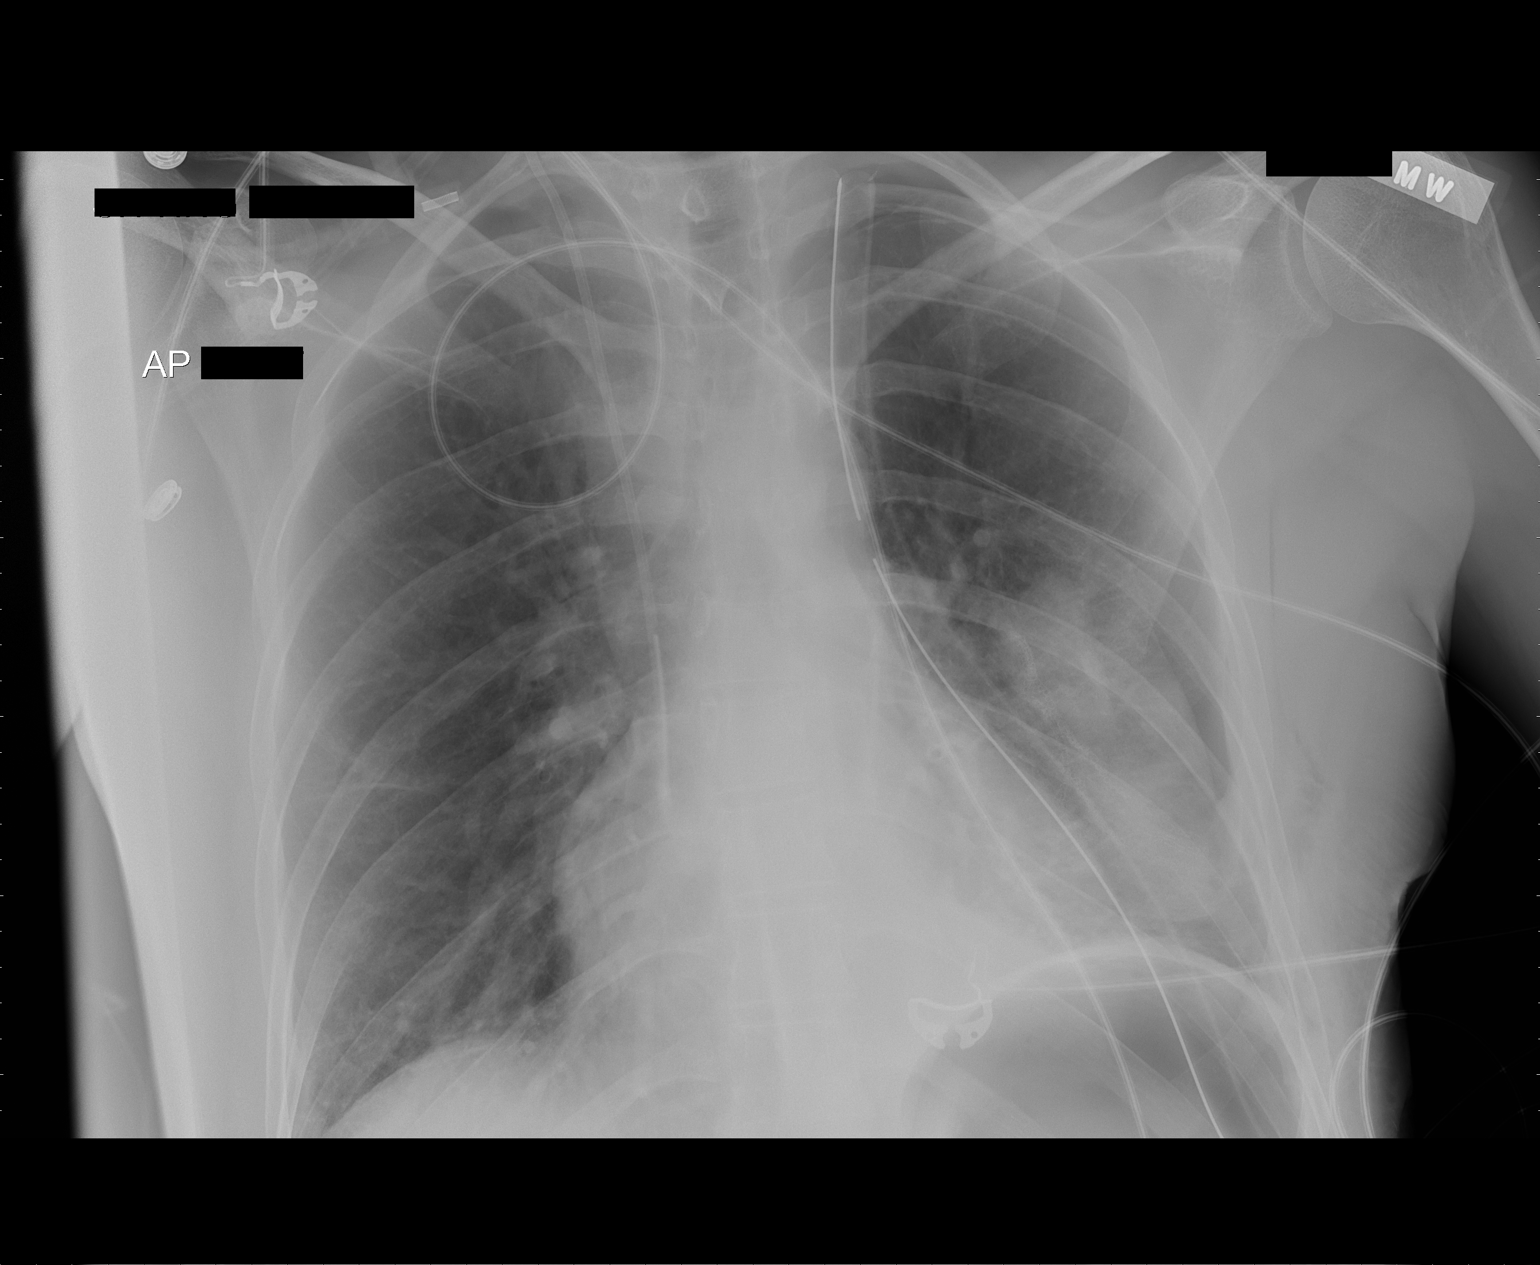

[2 of 2 positions shown; findings below may reference images not displayed]

FINDINGS: Right IJ central line terminates at the caval/atrial
junction.  Left-sided chest tube unchanged in position.  The 5 -
10% left apical pneumothorax is not significantly changed.

No pleural fluid.  Left lower lobe airspace disease and surgical
changes are similar.
IMPRESSION: 1. No significant change since the prior exam.
2.  Left apical pneumothorax 5 - 10%, left chest tube in place.

## 2012-06-19 DIAGNOSIS — J019 Acute sinusitis, unspecified: Secondary | ICD-10-CM | POA: Diagnosis not present

## 2012-06-19 DIAGNOSIS — R197 Diarrhea, unspecified: Secondary | ICD-10-CM | POA: Diagnosis not present

## 2012-07-08 DIAGNOSIS — B20 Human immunodeficiency virus [HIV] disease: Secondary | ICD-10-CM | POA: Diagnosis not present

## 2012-08-15 DIAGNOSIS — F17200 Nicotine dependence, unspecified, uncomplicated: Secondary | ICD-10-CM | POA: Diagnosis not present

## 2012-08-15 DIAGNOSIS — Z7189 Other specified counseling: Secondary | ICD-10-CM | POA: Diagnosis not present

## 2012-08-15 DIAGNOSIS — I10 Essential (primary) hypertension: Secondary | ICD-10-CM | POA: Diagnosis not present

## 2012-08-15 DIAGNOSIS — B20 Human immunodeficiency virus [HIV] disease: Secondary | ICD-10-CM | POA: Diagnosis not present

## 2012-08-15 DIAGNOSIS — Z79899 Other long term (current) drug therapy: Secondary | ICD-10-CM | POA: Diagnosis not present

## 2012-10-21 DIAGNOSIS — J019 Acute sinusitis, unspecified: Secondary | ICD-10-CM | POA: Diagnosis not present

## 2012-10-21 DIAGNOSIS — J209 Acute bronchitis, unspecified: Secondary | ICD-10-CM | POA: Diagnosis not present

## 2012-11-21 DIAGNOSIS — J019 Acute sinusitis, unspecified: Secondary | ICD-10-CM | POA: Diagnosis not present

## 2013-01-28 ENCOUNTER — Other Ambulatory Visit: Payer: Self-pay | Admitting: Internal Medicine

## 2013-01-28 NOTE — Telephone Encounter (Signed)
Called pharmacy, gave [employer] Andree Coss, RN

## 2013-02-17 ENCOUNTER — Other Ambulatory Visit: Payer: Self-pay | Admitting: Infectious Disease

## 2013-02-18 DIAGNOSIS — F411 Generalized anxiety disorder: Secondary | ICD-10-CM | POA: Diagnosis not present

## 2013-02-18 DIAGNOSIS — K921 Melena: Secondary | ICD-10-CM | POA: Diagnosis not present

## 2013-02-18 DIAGNOSIS — Z23 Encounter for immunization: Secondary | ICD-10-CM | POA: Diagnosis not present

## 2013-02-18 DIAGNOSIS — Z79899 Other long term (current) drug therapy: Secondary | ICD-10-CM | POA: Diagnosis not present

## 2013-02-18 DIAGNOSIS — B20 Human immunodeficiency virus [HIV] disease: Secondary | ICD-10-CM | POA: Diagnosis not present

## 2013-02-18 DIAGNOSIS — F17200 Nicotine dependence, unspecified, uncomplicated: Secondary | ICD-10-CM | POA: Diagnosis not present

## 2013-02-27 DIAGNOSIS — Z23 Encounter for immunization: Secondary | ICD-10-CM | POA: Diagnosis not present

## 2013-02-27 DIAGNOSIS — F17200 Nicotine dependence, unspecified, uncomplicated: Secondary | ICD-10-CM | POA: Diagnosis not present

## 2013-02-27 DIAGNOSIS — B20 Human immunodeficiency virus [HIV] disease: Secondary | ICD-10-CM | POA: Diagnosis not present

## 2013-02-27 DIAGNOSIS — F411 Generalized anxiety disorder: Secondary | ICD-10-CM | POA: Diagnosis not present

## 2013-02-27 DIAGNOSIS — K921 Melena: Secondary | ICD-10-CM | POA: Diagnosis not present

## 2013-02-27 DIAGNOSIS — Z79899 Other long term (current) drug therapy: Secondary | ICD-10-CM | POA: Diagnosis not present

## 2013-03-05 DIAGNOSIS — B20 Human immunodeficiency virus [HIV] disease: Secondary | ICD-10-CM | POA: Diagnosis not present

## 2013-04-07 DIAGNOSIS — K921 Melena: Secondary | ICD-10-CM | POA: Diagnosis not present

## 2013-04-07 DIAGNOSIS — K219 Gastro-esophageal reflux disease without esophagitis: Secondary | ICD-10-CM | POA: Diagnosis not present

## 2013-04-22 DIAGNOSIS — F17200 Nicotine dependence, unspecified, uncomplicated: Secondary | ICD-10-CM | POA: Diagnosis not present

## 2013-04-22 DIAGNOSIS — K921 Melena: Secondary | ICD-10-CM | POA: Diagnosis not present

## 2013-04-22 DIAGNOSIS — A630 Anogenital (venereal) warts: Secondary | ICD-10-CM | POA: Diagnosis not present

## 2013-04-22 DIAGNOSIS — B20 Human immunodeficiency virus [HIV] disease: Secondary | ICD-10-CM | POA: Diagnosis not present

## 2013-04-22 DIAGNOSIS — I10 Essential (primary) hypertension: Secondary | ICD-10-CM | POA: Diagnosis not present

## 2013-05-05 DIAGNOSIS — A630 Anogenital (venereal) warts: Secondary | ICD-10-CM | POA: Diagnosis not present

## 2013-05-05 DIAGNOSIS — B20 Human immunodeficiency virus [HIV] disease: Secondary | ICD-10-CM | POA: Diagnosis not present

## 2013-05-14 DIAGNOSIS — Z79899 Other long term (current) drug therapy: Secondary | ICD-10-CM | POA: Diagnosis not present

## 2013-05-14 DIAGNOSIS — D379 Neoplasm of uncertain behavior of digestive organ, unspecified: Secondary | ICD-10-CM | POA: Diagnosis not present

## 2013-05-14 DIAGNOSIS — F17200 Nicotine dependence, unspecified, uncomplicated: Secondary | ICD-10-CM | POA: Diagnosis not present

## 2013-05-14 DIAGNOSIS — A630 Anogenital (venereal) warts: Secondary | ICD-10-CM | POA: Diagnosis not present

## 2013-05-14 DIAGNOSIS — I10 Essential (primary) hypertension: Secondary | ICD-10-CM | POA: Diagnosis not present

## 2013-05-14 DIAGNOSIS — Z21 Asymptomatic human immunodeficiency virus [HIV] infection status: Secondary | ICD-10-CM | POA: Diagnosis not present

## 2013-07-03 DIAGNOSIS — M25569 Pain in unspecified knee: Secondary | ICD-10-CM | POA: Diagnosis not present

## 2013-07-03 DIAGNOSIS — S99919A Unspecified injury of unspecified ankle, initial encounter: Secondary | ICD-10-CM | POA: Diagnosis not present

## 2013-07-03 DIAGNOSIS — S8990XA Unspecified injury of unspecified lower leg, initial encounter: Secondary | ICD-10-CM | POA: Diagnosis not present

## 2013-07-03 DIAGNOSIS — W010XXA Fall on same level from slipping, tripping and stumbling without subsequent striking against object, initial encounter: Secondary | ICD-10-CM | POA: Diagnosis not present

## 2013-07-03 DIAGNOSIS — IMO0002 Reserved for concepts with insufficient information to code with codable children: Secondary | ICD-10-CM | POA: Diagnosis not present

## 2013-07-15 ENCOUNTER — Other Ambulatory Visit: Payer: Self-pay | Admitting: Internal Medicine

## 2013-07-29 ENCOUNTER — Other Ambulatory Visit: Payer: Self-pay | Admitting: Internal Medicine

## 2013-08-18 DIAGNOSIS — F17200 Nicotine dependence, unspecified, uncomplicated: Secondary | ICD-10-CM | POA: Diagnosis not present

## 2013-08-18 DIAGNOSIS — Z79899 Other long term (current) drug therapy: Secondary | ICD-10-CM | POA: Diagnosis not present

## 2013-08-18 DIAGNOSIS — B20 Human immunodeficiency virus [HIV] disease: Secondary | ICD-10-CM | POA: Diagnosis not present

## 2013-08-18 DIAGNOSIS — N289 Disorder of kidney and ureter, unspecified: Secondary | ICD-10-CM | POA: Diagnosis not present

## 2013-08-18 DIAGNOSIS — N529 Male erectile dysfunction, unspecified: Secondary | ICD-10-CM | POA: Diagnosis not present

## 2013-08-18 DIAGNOSIS — R7309 Other abnormal glucose: Secondary | ICD-10-CM | POA: Diagnosis not present

## 2013-08-18 DIAGNOSIS — F341 Dysthymic disorder: Secondary | ICD-10-CM | POA: Diagnosis not present

## 2013-08-28 DIAGNOSIS — R7309 Other abnormal glucose: Secondary | ICD-10-CM | POA: Diagnosis not present

## 2013-08-28 DIAGNOSIS — N529 Male erectile dysfunction, unspecified: Secondary | ICD-10-CM | POA: Diagnosis not present

## 2013-08-28 DIAGNOSIS — F341 Dysthymic disorder: Secondary | ICD-10-CM | POA: Diagnosis not present

## 2013-08-28 DIAGNOSIS — F17200 Nicotine dependence, unspecified, uncomplicated: Secondary | ICD-10-CM | POA: Diagnosis not present

## 2013-08-28 DIAGNOSIS — N289 Disorder of kidney and ureter, unspecified: Secondary | ICD-10-CM | POA: Diagnosis not present

## 2013-08-28 DIAGNOSIS — B20 Human immunodeficiency virus [HIV] disease: Secondary | ICD-10-CM | POA: Diagnosis not present

## 2013-09-01 DIAGNOSIS — B20 Human immunodeficiency virus [HIV] disease: Secondary | ICD-10-CM

## 2013-09-12 ENCOUNTER — Other Ambulatory Visit: Payer: Self-pay | Admitting: Internal Medicine

## 2013-09-12 DIAGNOSIS — B20 Human immunodeficiency virus [HIV] disease: Secondary | ICD-10-CM

## 2013-11-13 DIAGNOSIS — K219 Gastro-esophageal reflux disease without esophagitis: Secondary | ICD-10-CM | POA: Diagnosis not present

## 2013-11-13 DIAGNOSIS — F17200 Nicotine dependence, unspecified, uncomplicated: Secondary | ICD-10-CM | POA: Diagnosis not present

## 2013-11-13 DIAGNOSIS — F411 Generalized anxiety disorder: Secondary | ICD-10-CM | POA: Diagnosis not present

## 2013-11-13 DIAGNOSIS — B20 Human immunodeficiency virus [HIV] disease: Secondary | ICD-10-CM | POA: Diagnosis not present

## 2013-11-13 DIAGNOSIS — Z79899 Other long term (current) drug therapy: Secondary | ICD-10-CM | POA: Diagnosis not present

## 2013-11-13 DIAGNOSIS — Z23 Encounter for immunization: Secondary | ICD-10-CM | POA: Diagnosis not present

## 2013-11-14 DIAGNOSIS — B20 Human immunodeficiency virus [HIV] disease: Secondary | ICD-10-CM | POA: Diagnosis not present

## 2013-12-03 DIAGNOSIS — R112 Nausea with vomiting, unspecified: Secondary | ICD-10-CM | POA: Diagnosis not present

## 2013-12-03 DIAGNOSIS — K219 Gastro-esophageal reflux disease without esophagitis: Secondary | ICD-10-CM | POA: Diagnosis not present

## 2013-12-11 DIAGNOSIS — K219 Gastro-esophageal reflux disease without esophagitis: Secondary | ICD-10-CM | POA: Diagnosis not present

## 2013-12-11 DIAGNOSIS — Z86010 Personal history of colonic polyps: Secondary | ICD-10-CM | POA: Diagnosis not present

## 2013-12-11 DIAGNOSIS — Z800 Family history of malignant neoplasm of digestive organs: Secondary | ICD-10-CM | POA: Diagnosis not present

## 2013-12-11 DIAGNOSIS — K2970 Gastritis, unspecified, without bleeding: Secondary | ICD-10-CM | POA: Diagnosis not present

## 2013-12-11 DIAGNOSIS — I10 Essential (primary) hypertension: Secondary | ICD-10-CM | POA: Diagnosis not present

## 2013-12-11 DIAGNOSIS — Z79899 Other long term (current) drug therapy: Secondary | ICD-10-CM | POA: Diagnosis not present

## 2013-12-11 DIAGNOSIS — R110 Nausea: Secondary | ICD-10-CM | POA: Diagnosis not present

## 2013-12-11 DIAGNOSIS — R112 Nausea with vomiting, unspecified: Secondary | ICD-10-CM | POA: Diagnosis not present

## 2013-12-11 DIAGNOSIS — R1013 Epigastric pain: Secondary | ICD-10-CM | POA: Diagnosis not present

## 2013-12-11 DIAGNOSIS — F17200 Nicotine dependence, unspecified, uncomplicated: Secondary | ICD-10-CM | POA: Diagnosis not present

## 2013-12-11 DIAGNOSIS — K2990 Gastroduodenitis, unspecified, without bleeding: Secondary | ICD-10-CM | POA: Diagnosis not present

## 2013-12-11 DIAGNOSIS — A630 Anogenital (venereal) warts: Secondary | ICD-10-CM | POA: Diagnosis not present

## 2013-12-11 DIAGNOSIS — Z21 Asymptomatic human immunodeficiency virus [HIV] infection status: Secondary | ICD-10-CM | POA: Diagnosis not present

## 2013-12-11 DIAGNOSIS — K3189 Other diseases of stomach and duodenum: Secondary | ICD-10-CM | POA: Diagnosis not present

## 2013-12-11 DIAGNOSIS — K591 Functional diarrhea: Secondary | ICD-10-CM | POA: Diagnosis not present

## 2014-01-15 DIAGNOSIS — K219 Gastro-esophageal reflux disease without esophagitis: Secondary | ICD-10-CM | POA: Diagnosis not present

## 2014-01-15 DIAGNOSIS — Z79899 Other long term (current) drug therapy: Secondary | ICD-10-CM | POA: Diagnosis not present

## 2014-01-15 DIAGNOSIS — F411 Generalized anxiety disorder: Secondary | ICD-10-CM | POA: Diagnosis not present

## 2014-01-15 DIAGNOSIS — B20 Human immunodeficiency virus [HIV] disease: Secondary | ICD-10-CM | POA: Diagnosis not present

## 2014-01-15 DIAGNOSIS — R112 Nausea with vomiting, unspecified: Secondary | ICD-10-CM | POA: Diagnosis not present

## 2014-01-15 DIAGNOSIS — F17200 Nicotine dependence, unspecified, uncomplicated: Secondary | ICD-10-CM | POA: Diagnosis not present

## 2014-01-19 ENCOUNTER — Other Ambulatory Visit: Payer: Self-pay | Admitting: Infectious Disease

## 2014-01-19 DIAGNOSIS — B20 Human immunodeficiency virus [HIV] disease: Secondary | ICD-10-CM | POA: Diagnosis not present

## 2014-01-20 ENCOUNTER — Other Ambulatory Visit: Payer: Self-pay | Admitting: Infectious Disease

## 2014-01-20 ENCOUNTER — Other Ambulatory Visit: Payer: Self-pay | Admitting: Internal Medicine

## 2014-02-24 ENCOUNTER — Other Ambulatory Visit: Payer: Self-pay | Admitting: Infectious Disease

## 2014-02-25 ENCOUNTER — Other Ambulatory Visit: Payer: Self-pay | Admitting: Infectious Disease

## 2014-04-27 DIAGNOSIS — B20 Human immunodeficiency virus [HIV] disease: Secondary | ICD-10-CM | POA: Diagnosis not present

## 2014-05-14 DIAGNOSIS — Z23 Encounter for immunization: Secondary | ICD-10-CM | POA: Diagnosis not present

## 2014-05-14 DIAGNOSIS — N289 Disorder of kidney and ureter, unspecified: Secondary | ICD-10-CM | POA: Diagnosis not present

## 2014-05-14 DIAGNOSIS — B20 Human immunodeficiency virus [HIV] disease: Secondary | ICD-10-CM | POA: Diagnosis not present

## 2014-05-14 DIAGNOSIS — Z79899 Other long term (current) drug therapy: Secondary | ICD-10-CM | POA: Diagnosis not present

## 2014-05-21 DIAGNOSIS — B20 Human immunodeficiency virus [HIV] disease: Secondary | ICD-10-CM

## 2014-05-25 DIAGNOSIS — B20 Human immunodeficiency virus [HIV] disease: Secondary | ICD-10-CM | POA: Diagnosis not present

## 2014-05-25 DIAGNOSIS — Z79899 Other long term (current) drug therapy: Secondary | ICD-10-CM | POA: Diagnosis not present

## 2014-05-25 DIAGNOSIS — N179 Acute kidney failure, unspecified: Secondary | ICD-10-CM | POA: Diagnosis not present

## 2014-07-16 DIAGNOSIS — Z23 Encounter for immunization: Secondary | ICD-10-CM | POA: Diagnosis not present

## 2014-07-16 DIAGNOSIS — B20 Human immunodeficiency virus [HIV] disease: Secondary | ICD-10-CM | POA: Diagnosis not present

## 2014-07-16 DIAGNOSIS — F419 Anxiety disorder, unspecified: Secondary | ICD-10-CM | POA: Diagnosis not present

## 2014-07-16 DIAGNOSIS — F17200 Nicotine dependence, unspecified, uncomplicated: Secondary | ICD-10-CM | POA: Diagnosis not present

## 2014-07-16 DIAGNOSIS — N189 Chronic kidney disease, unspecified: Secondary | ICD-10-CM | POA: Diagnosis not present

## 2014-07-16 DIAGNOSIS — N281 Cyst of kidney, acquired: Secondary | ICD-10-CM | POA: Diagnosis not present

## 2014-07-17 DIAGNOSIS — B20 Human immunodeficiency virus [HIV] disease: Secondary | ICD-10-CM | POA: Diagnosis not present

## 2014-09-01 DIAGNOSIS — B20 Human immunodeficiency virus [HIV] disease: Secondary | ICD-10-CM | POA: Diagnosis not present

## 2014-09-01 DIAGNOSIS — Z79899 Other long term (current) drug therapy: Secondary | ICD-10-CM | POA: Diagnosis not present

## 2014-09-17 DIAGNOSIS — B20 Human immunodeficiency virus [HIV] disease: Secondary | ICD-10-CM | POA: Diagnosis not present

## 2014-09-17 DIAGNOSIS — Z23 Encounter for immunization: Secondary | ICD-10-CM | POA: Diagnosis not present

## 2014-09-21 DIAGNOSIS — B20 Human immunodeficiency virus [HIV] disease: Secondary | ICD-10-CM | POA: Diagnosis not present

## 2014-10-19 DIAGNOSIS — Z23 Encounter for immunization: Secondary | ICD-10-CM | POA: Diagnosis not present

## 2014-10-19 DIAGNOSIS — B20 Human immunodeficiency virus [HIV] disease: Secondary | ICD-10-CM | POA: Diagnosis not present

## 2014-10-29 DIAGNOSIS — Z6820 Body mass index (BMI) 20.0-20.9, adult: Secondary | ICD-10-CM | POA: Diagnosis not present

## 2014-10-29 DIAGNOSIS — R1010 Upper abdominal pain, unspecified: Secondary | ICD-10-CM | POA: Diagnosis not present

## 2014-10-29 DIAGNOSIS — R197 Diarrhea, unspecified: Secondary | ICD-10-CM | POA: Diagnosis not present

## 2014-10-29 DIAGNOSIS — R1013 Epigastric pain: Secondary | ICD-10-CM | POA: Diagnosis not present

## 2014-12-03 DIAGNOSIS — B20 Human immunodeficiency virus [HIV] disease: Secondary | ICD-10-CM | POA: Diagnosis not present

## 2014-12-03 DIAGNOSIS — Z79899 Other long term (current) drug therapy: Secondary | ICD-10-CM | POA: Diagnosis not present

## 2014-12-17 DIAGNOSIS — B20 Human immunodeficiency virus [HIV] disease: Secondary | ICD-10-CM | POA: Diagnosis not present

## 2014-12-17 DIAGNOSIS — R635 Abnormal weight gain: Secondary | ICD-10-CM | POA: Diagnosis not present

## 2014-12-28 DIAGNOSIS — B20 Human immunodeficiency virus [HIV] disease: Secondary | ICD-10-CM | POA: Diagnosis not present

## 2015-02-02 DIAGNOSIS — B20 Human immunodeficiency virus [HIV] disease: Secondary | ICD-10-CM | POA: Diagnosis not present

## 2015-02-02 DIAGNOSIS — Z79899 Other long term (current) drug therapy: Secondary | ICD-10-CM | POA: Diagnosis not present

## 2015-02-11 DIAGNOSIS — Z23 Encounter for immunization: Secondary | ICD-10-CM | POA: Diagnosis not present

## 2015-02-11 DIAGNOSIS — F418 Other specified anxiety disorders: Secondary | ICD-10-CM | POA: Diagnosis not present

## 2015-02-11 DIAGNOSIS — F17210 Nicotine dependence, cigarettes, uncomplicated: Secondary | ICD-10-CM | POA: Diagnosis not present

## 2015-02-11 DIAGNOSIS — N189 Chronic kidney disease, unspecified: Secondary | ICD-10-CM | POA: Diagnosis not present

## 2015-02-11 DIAGNOSIS — B20 Human immunodeficiency virus [HIV] disease: Secondary | ICD-10-CM | POA: Diagnosis not present

## 2015-02-11 DIAGNOSIS — G4700 Insomnia, unspecified: Secondary | ICD-10-CM | POA: Diagnosis not present

## 2015-02-24 DIAGNOSIS — B20 Human immunodeficiency virus [HIV] disease: Secondary | ICD-10-CM | POA: Diagnosis not present

## 2015-03-12 DIAGNOSIS — Z23 Encounter for immunization: Secondary | ICD-10-CM | POA: Diagnosis not present

## 2015-03-12 DIAGNOSIS — B20 Human immunodeficiency virus [HIV] disease: Secondary | ICD-10-CM | POA: Diagnosis not present

## 2015-08-03 DIAGNOSIS — Z79899 Other long term (current) drug therapy: Secondary | ICD-10-CM | POA: Diagnosis not present

## 2015-08-03 DIAGNOSIS — B20 Human immunodeficiency virus [HIV] disease: Secondary | ICD-10-CM | POA: Diagnosis not present

## 2015-08-12 DIAGNOSIS — N183 Chronic kidney disease, stage 3 (moderate): Secondary | ICD-10-CM | POA: Diagnosis not present

## 2015-08-12 DIAGNOSIS — B20 Human immunodeficiency virus [HIV] disease: Secondary | ICD-10-CM | POA: Diagnosis not present

## 2015-08-12 DIAGNOSIS — G4700 Insomnia, unspecified: Secondary | ICD-10-CM | POA: Diagnosis not present

## 2015-08-12 DIAGNOSIS — R630 Anorexia: Secondary | ICD-10-CM | POA: Diagnosis not present

## 2015-08-12 DIAGNOSIS — F418 Other specified anxiety disorders: Secondary | ICD-10-CM | POA: Diagnosis not present

## 2015-08-17 DIAGNOSIS — B20 Human immunodeficiency virus [HIV] disease: Secondary | ICD-10-CM | POA: Diagnosis not present

## 2015-10-26 DIAGNOSIS — B20 Human immunodeficiency virus [HIV] disease: Secondary | ICD-10-CM | POA: Diagnosis not present

## 2015-11-11 DIAGNOSIS — B20 Human immunodeficiency virus [HIV] disease: Secondary | ICD-10-CM | POA: Diagnosis not present

## 2015-11-11 DIAGNOSIS — F17200 Nicotine dependence, unspecified, uncomplicated: Secondary | ICD-10-CM | POA: Diagnosis not present

## 2015-11-11 DIAGNOSIS — Z79899 Other long term (current) drug therapy: Secondary | ICD-10-CM | POA: Diagnosis not present

## 2015-11-11 DIAGNOSIS — L723 Sebaceous cyst: Secondary | ICD-10-CM | POA: Diagnosis not present

## 2015-11-19 DIAGNOSIS — B20 Human immunodeficiency virus [HIV] disease: Secondary | ICD-10-CM | POA: Diagnosis not present

## 2016-05-08 DIAGNOSIS — Z23 Encounter for immunization: Secondary | ICD-10-CM | POA: Diagnosis not present

## 2016-05-08 DIAGNOSIS — Z79899 Other long term (current) drug therapy: Secondary | ICD-10-CM | POA: Diagnosis not present

## 2016-05-08 DIAGNOSIS — J302 Other seasonal allergic rhinitis: Secondary | ICD-10-CM | POA: Diagnosis not present

## 2016-05-08 DIAGNOSIS — B20 Human immunodeficiency virus [HIV] disease: Secondary | ICD-10-CM | POA: Diagnosis not present

## 2016-05-18 DIAGNOSIS — B20 Human immunodeficiency virus [HIV] disease: Secondary | ICD-10-CM | POA: Diagnosis not present

## 2016-05-18 DIAGNOSIS — J302 Other seasonal allergic rhinitis: Secondary | ICD-10-CM | POA: Diagnosis not present

## 2016-05-18 DIAGNOSIS — Z79899 Other long term (current) drug therapy: Secondary | ICD-10-CM | POA: Diagnosis not present

## 2016-05-18 DIAGNOSIS — Z23 Encounter for immunization: Secondary | ICD-10-CM | POA: Diagnosis not present

## 2016-05-31 DIAGNOSIS — B20 Human immunodeficiency virus [HIV] disease: Secondary | ICD-10-CM | POA: Diagnosis not present

## 2016-11-01 DIAGNOSIS — B20 Human immunodeficiency virus [HIV] disease: Secondary | ICD-10-CM | POA: Diagnosis not present

## 2016-11-16 DIAGNOSIS — B20 Human immunodeficiency virus [HIV] disease: Secondary | ICD-10-CM | POA: Diagnosis not present

## 2016-11-16 DIAGNOSIS — Z23 Encounter for immunization: Secondary | ICD-10-CM | POA: Diagnosis not present

## 2016-11-16 DIAGNOSIS — F17210 Nicotine dependence, cigarettes, uncomplicated: Secondary | ICD-10-CM | POA: Diagnosis not present

## 2016-11-16 DIAGNOSIS — Z902 Acquired absence of lung [part of]: Secondary | ICD-10-CM | POA: Diagnosis not present

## 2016-11-16 DIAGNOSIS — N189 Chronic kidney disease, unspecified: Secondary | ICD-10-CM | POA: Diagnosis not present

## 2016-11-27 DIAGNOSIS — B20 Human immunodeficiency virus [HIV] disease: Secondary | ICD-10-CM | POA: Diagnosis not present

## 2016-12-27 ENCOUNTER — Other Ambulatory Visit: Payer: Self-pay | Admitting: Infectious Disease

## 2017-01-30 ENCOUNTER — Other Ambulatory Visit: Payer: Self-pay | Admitting: Infectious Disease

## 2017-05-08 DIAGNOSIS — B20 Human immunodeficiency virus [HIV] disease: Secondary | ICD-10-CM | POA: Diagnosis not present

## 2017-05-08 DIAGNOSIS — Z79899 Other long term (current) drug therapy: Secondary | ICD-10-CM | POA: Diagnosis not present

## 2017-05-17 DIAGNOSIS — Z79899 Other long term (current) drug therapy: Secondary | ICD-10-CM | POA: Diagnosis not present

## 2017-05-17 DIAGNOSIS — B20 Human immunodeficiency virus [HIV] disease: Secondary | ICD-10-CM | POA: Diagnosis not present

## 2017-07-10 DIAGNOSIS — B20 Human immunodeficiency virus [HIV] disease: Secondary | ICD-10-CM | POA: Diagnosis not present

## 2017-07-12 DIAGNOSIS — J029 Acute pharyngitis, unspecified: Secondary | ICD-10-CM | POA: Diagnosis not present

## 2017-07-17 DIAGNOSIS — K209 Esophagitis, unspecified: Secondary | ICD-10-CM | POA: Diagnosis not present

## 2017-07-17 DIAGNOSIS — J209 Acute bronchitis, unspecified: Secondary | ICD-10-CM | POA: Diagnosis not present

## 2017-07-20 DIAGNOSIS — B20 Human immunodeficiency virus [HIV] disease: Secondary | ICD-10-CM | POA: Diagnosis not present

## 2017-11-15 DIAGNOSIS — B20 Human immunodeficiency virus [HIV] disease: Secondary | ICD-10-CM | POA: Diagnosis not present

## 2017-12-14 DIAGNOSIS — Z23 Encounter for immunization: Secondary | ICD-10-CM | POA: Diagnosis not present

## 2017-12-14 DIAGNOSIS — B20 Human immunodeficiency virus [HIV] disease: Secondary | ICD-10-CM | POA: Diagnosis not present

## 2018-06-06 ENCOUNTER — Other Ambulatory Visit: Payer: Self-pay | Admitting: Internal Medicine

## 2018-06-13 DIAGNOSIS — G990 Autonomic neuropathy in diseases classified elsewhere: Secondary | ICD-10-CM | POA: Diagnosis not present

## 2018-06-13 DIAGNOSIS — Z79899 Other long term (current) drug therapy: Secondary | ICD-10-CM

## 2018-06-13 DIAGNOSIS — B20 Human immunodeficiency virus [HIV] disease: Secondary | ICD-10-CM

## 2018-06-13 DIAGNOSIS — J302 Other seasonal allergic rhinitis: Secondary | ICD-10-CM

## 2019-03-13 DIAGNOSIS — Z720 Tobacco use: Secondary | ICD-10-CM

## 2019-03-13 DIAGNOSIS — B20 Human immunodeficiency virus [HIV] disease: Secondary | ICD-10-CM

## 2019-09-02 ENCOUNTER — Encounter: Payer: Self-pay | Admitting: Internal Medicine

## 2019-09-02 NOTE — Progress Notes (Signed)
Patient ID: [name], male   DOB: 1981-03-06, 39 y.o.   MRN: [id number] [facility] patient of Dr Megan Salon  cld to schedule lab and follow up visit unable to leave a message

## 2019-10-08 ENCOUNTER — Other Ambulatory Visit: Payer: Self-pay | Admitting: Infectious Diseases
